# Patient Record
Sex: Female | Born: 1995 | Race: Black or African American | Hispanic: No | Marital: Single | Smoking: Never smoker
Health system: Southern US, Community
[De-identification: ages and names within clinical notes are randomized; demographics above are authoritative.]

## PROBLEM LIST (undated history)

## (undated) DIAGNOSIS — F988 Other specified behavioral and emotional disorders with onset usually occurring in childhood and adolescence: Secondary | ICD-10-CM

## (undated) DIAGNOSIS — F419 Anxiety disorder, unspecified: Secondary | ICD-10-CM

## (undated) HISTORY — PX: LIPOSUCTION: SHX10

## (undated) HISTORY — PX: KELOID EXCISION: SHX1856

## (undated) HISTORY — PX: TUMOR REMOVAL: SHX12

---
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
## 2019-03-09 ENCOUNTER — Other Ambulatory Visit: Payer: Self-pay

## 2019-03-09 ENCOUNTER — Emergency Department (INDEPENDENT_AMBULATORY_CARE_PROVIDER_SITE_OTHER)
Admission: EM | Admit: 2019-03-09 | Discharge: 2019-03-09 | Disposition: A | Discharge status: Home / Self Care | Payer: BC Managed Care – PPO | Source: Home / Self Care | Attending: Family Medicine | Admitting: Family Medicine

## 2019-03-09 ENCOUNTER — Emergency Department (INDEPENDENT_AMBULATORY_CARE_PROVIDER_SITE_OTHER): Payer: BC Managed Care – PPO

## 2019-03-09 ENCOUNTER — Encounter: Payer: Self-pay | Admitting: Emergency Medicine

## 2019-03-09 DIAGNOSIS — R51 Headache: Secondary | ICD-10-CM

## 2019-03-09 DIAGNOSIS — R079 Chest pain, unspecified: Secondary | ICD-10-CM

## 2019-03-09 DIAGNOSIS — M940 Chondrocostal junction syndrome [Tietze]: Secondary | ICD-10-CM

## 2019-03-09 DIAGNOSIS — R634 Abnormal weight loss: Secondary | ICD-10-CM | POA: Diagnosis not present

## 2019-03-09 DIAGNOSIS — K219 Gastro-esophageal reflux disease without esophagitis: Secondary | ICD-10-CM

## 2019-03-09 MED ORDER — PREDNISONE 20 MG PO TABS: ORAL_TABLET | ORAL | 0 refills | Status: DC

## 2019-03-09 MED ORDER — ESOMEPRAZOLE MAGNESIUM 40 MG PO CPDR: DELAYED_RELEASE_CAPSULE | ORAL | 0 refills | Status: DC

## 2019-03-09 NOTE — ED Provider Notes (Signed)
[facility]    CSN: [id number] Arrival date & time: 03/09/19  1711     History   Chief Complaint Chief Complaint  Patient presents with  . Chest Pain    HPI Lisa Woodard is a 23 y.o. female.   Patient complains of 3 week history of intermittent nausea without vomiting, and during the past 2 days she has had upper left anterior chest pain radiating to her left arm.  The chest pain is generally constant.  She states that she has had acid reflux "all my life" and frequently takes Tums with improvement.  She states that she has been eating a lot of "smoothies," and 3 weeks ago weighed 213 pounds (96kg).  She has not been trying to lose weight.  She denies changes in bowel movements.  The history is provided by the patient.  Chest Pain Pain location:  Substernal area and L chest Pain quality: aching and sharp   Pain radiates to:  L arm Pain severity:  Mild Onset quality:  Gradual Duration:  2 hours Timing:  Constant Progression:  Unchanged Chronicity:  New Context: movement and at rest   Context: not breathing, not eating, not lifting, not raising an arm, not stress and not trauma   Relieved by:  None tried Worsened by:  Deep breathing and movement Ineffective treatments:  None tried Associated symptoms: AICD problem, dysphagia, heartburn and nausea   Associated symptoms: no abdominal pain, no anorexia, no anxiety, no back pain, no claudication, no cough, no diaphoresis, no dizziness, no fatigue, no fever, no headache, no lower extremity edema, no near-syncope, no numbness, no orthopnea, no palpitations, no PND, no shortness of breath, no syncope, no vomiting and no weakness   Risk factors: obesity     Past medical history:  Obesity   Active problems:  Obesity, GERD   Surgical history:  "wisdom tooth" extraction 2018  OB History   No history of pregnancy      Home Medications    Prior to Admission medications   Medication Sig Start Date End Date  Taking? Authorizing Provider  esomeprazole (NEXIUM) 40 MG capsule Take one cap PO, 20 minutes AC 03/09/19   Lattie HawBeese, Aalijah Lanphere A, MD  predniSONE (DELTASONE) 20 MG tablet Take one tab by mouth twice daily for 4 days, then one daily. Take with food. 03/09/19   Lattie HawBeese, Zaylei Mullane A, MD    Family History Mother and brother have "heart problems"  Social History Social History   Tobacco Use  . Smoking status: Never Smoker  . Smokeless tobacco: Never Used  Substance Use Topics  . Alcohol use: Yes  . Drug use: No     Allergies   Patient has no known allergies.   Review of Systems Review of Systems  Constitutional: Positive for appetite change and unexpected weight change. Negative for chills, diaphoresis, fatigue and fever.  HENT: Positive for trouble swallowing.   Eyes: Negative.   Respiratory: Negative for cough and shortness of breath.   Cardiovascular: Positive for chest pain. Negative for palpitations, orthopnea, claudication, leg swelling, syncope, PND and near-syncope.  Gastrointestinal: Positive for heartburn and nausea. Negative for abdominal pain, anorexia and vomiting.  Genitourinary: Negative.   Musculoskeletal: Negative for back pain.  Skin: Negative.   Neurological: Negative for dizziness, weakness, numbness and headaches.     Physical Exam Triage Vital Signs ED Triage Vitals [03/09/19 1750]  Enc Vitals Group     BP 107/64     Pulse Rate 86  Resp      Temp 98.2 F (36.8 C)     Temp Source Oral     SpO2 97 %     Weight      Height      Head Circumference      Peak Flow      Pain Score      Pain Loc      Pain Edu?      Excl. in GC?    No data found.  Updated Vital Signs BP 107/64 (BP Location: Right Arm)   Pulse 86   Temp 98.2 F (36.8 C) (Oral)   Ht 5\' 4"  (1.626 m)   Wt 86.2 kg   LMP 03/01/2019   SpO2 97%   BMI 32.61 kg/m   Visual Acuity Right Eye Distance:   Left Eye Distance:   Bilateral Distance:    Right Eye Near:   Left Eye Near:     Bilateral Near:     Physical Exam Vitals signs and nursing note reviewed.  Constitutional:      General: She is not in acute distress.    Appearance: She is obese.  HENT:     Head: Normocephalic.     Right Ear: External ear normal.     Left Ear: External ear normal.     Nose: Nose normal.     Mouth/Throat:     Pharynx: Oropharynx is clear.  Eyes:     Extraocular Movements: Extraocular movements intact.     Conjunctiva/sclera: Conjunctivae normal.     Pupils: Pupils are equal, round, and reactive to light.  Neck:     Musculoskeletal: Normal range of motion and neck supple.  Cardiovascular:     Rate and Rhythm: Normal rate.     Heart sounds: Normal heart sounds.  Pulmonary:     Breath sounds: Normal breath sounds.  Chest:       Comments: Chest:  Distinct tenderness to palpation over the superior and mid-sternum as noted on diagram.  Palpation there recreates her pain. Abdominal:     General: Bowel sounds are normal.     Tenderness: There is no abdominal tenderness.  Musculoskeletal:     Right lower leg: No edema.     Left lower leg: No edema.  Lymphadenopathy:     Cervical: No cervical adenopathy.  Skin:    General: Skin is warm and dry.     Findings: No rash.  Neurological:     Mental Status: She is alert.      UC Treatments / Results  Labs (all labs ordered are listed, but only abnormal results are displayed) Labs Reviewed  COMPREHENSIVE METABOLIC PANEL  AMYLASE  LIPASE, BLOOD    EKG  Rate:  89 BPM PR:  146 msec QT:  348 msec QTcH:  423 msec QRSD:  74 msec QRS axis:  -10 degrees Interpretation:  Normal sinus rhythm; moderate voltage criteria for LVH, may be normal variant.  No acute changes   Radiology Dg Chest 2 View  Result Date: 03/09/2019 CLINICAL DATA:  Rapid weight loss.  Headache. EXAM: CHEST - 2 VIEW COMPARISON:  None. FINDINGS: The heart size and mediastinal contours are within normal limits. Both lungs are clear. The visualized skeletal  structures are unremarkable. IMPRESSION: No active cardiopulmonary disease. Electronically Signed   By: Gerome Samavid  Williams III M.D   On: 03/09/2019 18:39    Procedures Procedures (including critical care time)  Medications Ordered in UC Medications - No data to display  Initial Impression / Assessment and Plan / UC Course  I have reviewed the triage vital signs and the nursing notes.  Pertinent labs & imaging results that were available during my care of the patient were reviewed by me and considered in my medical decision making (see chart for details).    Unremarkable EKG and chest x-ray reassuring. Patient appears to have exacerbation of GERD with overlay of costochondritis.  Begin Nexium and prednisone burst. Doubt pancreatitis but will check CMP, amylase, and lipase. Followup with Family Doctor in about 2 weeks.    Final Clinical Impressions(s) / UC Diagnoses   Final diagnoses:  Chest pain, unspecified type  Gastroesophageal reflux disease, esophagitis presence not specified  Costochondritis     Discharge Instructions      Put ice on the painful area: ? Put ice in a plastic bag. ? Place a towel between your skin and the bag. ? Leave the ice on for 20 minutes, 2-3 times a day.    ED Prescriptions    Medication Sig Dispense Auth. Provider   predniSONE (DELTASONE) 20 MG tablet Take one tab by mouth twice daily for 4 days, then one daily. Take with food. 12 tablet Kandra Nicolas, MD   esomeprazole (NEXIUM) 40 MG capsule Take one cap PO, 20 minutes AC 30 capsule Kandra Nicolas, MD        Kandra Nicolas, MD 03/12/19 1351

## 2019-03-09 NOTE — Discharge Instructions (Addendum)
Put ice on the painful area. Put ice in a plastic bag. Place a towel between your skin and the bag. Leave the ice on for 20 minutes, 2-3 times a day. 

## 2019-03-09 NOTE — ED Triage Notes (Addendum)
Upper left chest pain radiates into left arm, nausea, headache, weakness x  2days 3 weeks ago she weighed 213, was not trying to lose weight.

## 2019-03-11 ENCOUNTER — Telehealth: Payer: Self-pay

## 2019-03-11 LAB — COMPREHENSIVE METABOLIC PANEL
AG Ratio: 1.4 (calc) (ref 1.0–2.5)
ALT: 11 U/L (ref 6–29)
AST: 26 U/L (ref 10–30)
Albumin: 4.7 g/dL (ref 3.6–5.1)
Alkaline phosphatase (APISO): 62 U/L (ref 31–125)
BUN/Creatinine Ratio: 7 (calc) (ref 6–22)
BUN: 6 mg/dL — ABNORMAL LOW (ref 7–25)
CO2: 26 mmol/L (ref 20–32)
Calcium: 9.9 mg/dL (ref 8.6–10.2)
Chloride: 102 mmol/L (ref 98–110)
Creat: 0.86 mg/dL (ref 0.50–1.10)
Globulin: 3.3 g/dL (calc) (ref 1.9–3.7)
Glucose, Bld: 68 mg/dL (ref 65–99)
Potassium: 4 mmol/L (ref 3.5–5.3)
Sodium: 137 mmol/L (ref 135–146)
Total Bilirubin: 0.3 mg/dL (ref 0.2–1.2)
Total Protein: 8 g/dL (ref 6.1–8.1)

## 2019-03-11 LAB — AMYLASE: Amylase: 20 U/L — ABNORMAL LOW (ref 21–101)

## 2019-03-11 LAB — LIPASE: Lipase: 38 U/L (ref 7–60)

## 2019-03-11 NOTE — Telephone Encounter (Signed)
Left voice message inquiring about patients status. Given lab results. Advised to f/u with PCP as suggested by Dr Assunta Found and repeat labs. Encouraged patient to call with questions or concerns.

## 2019-03-12 ENCOUNTER — Telehealth: Payer: Self-pay

## 2020-01-23 ENCOUNTER — Encounter: Payer: Self-pay | Admitting: *Deleted

## 2020-01-23 ENCOUNTER — Emergency Department (INDEPENDENT_AMBULATORY_CARE_PROVIDER_SITE_OTHER)
Admission: EM | Admit: 2020-01-23 | Discharge: 2020-01-23 | Disposition: A | Discharge status: Home / Self Care | Payer: BC Managed Care – PPO | Source: Home / Self Care | Attending: Internal Medicine | Admitting: Internal Medicine

## 2020-01-23 ENCOUNTER — Other Ambulatory Visit: Payer: Self-pay

## 2020-01-23 DIAGNOSIS — R5383 Other fatigue: Secondary | ICD-10-CM

## 2020-01-23 DIAGNOSIS — R55 Syncope and collapse: Secondary | ICD-10-CM

## 2020-01-23 HISTORY — DX: Other specified behavioral and emotional disorders with onset usually occurring in childhood and adolescence: F98.8

## 2020-01-23 HISTORY — DX: Anxiety disorder, unspecified: F41.9

## 2020-01-23 LAB — POCT CBC W AUTO DIFF (K'VILLE URGENT CARE)

## 2020-01-23 LAB — POCT URINE PREGNANCY: Preg Test, Ur: NEGATIVE

## 2020-01-23 LAB — TSH: TSH: 0.7 mIU/L

## 2020-01-23 NOTE — ED Triage Notes (Addendum)
Pt reports that she passed out yesterday while shopping. She reports that EMS came, but she refused transport. She reports donating blood 2 days ago. She also reports that she has been drinking lots of water and Pedialyte. She is leaving on a flight at 6pm today going to [facility].

## 2020-01-23 NOTE — ED Provider Notes (Signed)
[facility]    CSN: [id number] Arrival date & time: 01/23/20  0933      History   Chief Complaint Chief Complaint  Patient presents with  . Loss of Consciousness    HPI Lisa Woodard is a 24 y.o. female comes to the urgent care to be evaluated after she passed out yesterday was in a shopping mall.  The patient donated blood to the [employer] yesterday.  Following that she went to the shopping mall.  She had eaten prior to the blood donation.  She was playing at the counter when she suddenly felt faint.  No nausea or vomiting.  No chest pain, chest pressure or palpitations.  She was not drinking much fluids yesterday.  No fever or chills.  No upper respiratory infection symptoms.  Last menstrual period was about a month ago.  She is sexually active and engages in unprotected sexual intercourse.  No recent weight change.  Patient is scheduled to go to [facility].  She wants to be evaluated before leaving for [facility].   HPI  Past Medical History:  Diagnosis Date  . ADD (attention deficit disorder)   . Anxiety     There are no problems to display for this patient.   Past Surgical History:  Procedure Laterality Date  . KELOID EXCISION    . LIPOSUCTION    . TUMOR REMOVAL      OB History   No obstetric history on file.      Home Medications    Prior to Admission medications   Medication Sig Start Date End Date Taking? Authorizing Provider  triamcinolone ointment (KENALOG) 0.1 % Apply to legs twice daily as needed 04/20/18  Yes [provider]  buPROPion (WELLBUTRIN XL) 300 MG 24 hr tablet Take by mouth.    [provider]    Family History Family History  Problem Relation Age of Onset  . Heart disease Mother     Social History Social History   Tobacco Use  . Smoking status: Never Smoker  . Smokeless tobacco: Never Used  Substance Use Topics  . Alcohol use: Yes  . Drug use: Never     Allergies   Patient has no known  allergies.   Review of Systems Review of Systems  Constitutional: Positive for fatigue. Negative for activity change, chills and fever.  HENT: Negative.   Respiratory: Negative for chest tightness, shortness of breath and wheezing.   Cardiovascular: Negative for chest pain and palpitations.  Gastrointestinal: Negative.  Negative for abdominal pain, diarrhea, nausea and vomiting.  Endocrine: Negative.   Genitourinary: Negative.   Musculoskeletal: Negative.   Skin: Negative.  Negative for color change and pallor.  Neurological: Positive for syncope. Negative for dizziness and facial asymmetry.  Psychiatric/Behavioral: Negative for confusion and decreased concentration.     Physical Exam Triage Vital Signs ED Triage Vitals  Enc Vitals Group     BP 01/23/20 1000 118/71     Pulse Rate 01/23/20 1000 73     Resp 01/23/20 1000 16     Temp 01/23/20 1000 98.9 F (37.2 C)     Temp Source 01/23/20 1000 Oral     SpO2 01/23/20 1000 99 %     Weight 01/23/20 0956 184 lb (83.5 kg)     Height 01/23/20 0956 5\' 7"  (1.702 m)     Head Circumference --      Peak Flow --      Pain Score 01/23/20 0956 0  Pain Loc --      Pain Edu? --      Excl. in [place]? --    Orthostatic VS for the past 24 hrs:  BP- Lying Pulse- Lying BP- Sitting Pulse- Sitting BP- Standing at 0 minutes Pulse- Standing at 0 minutes  01/23/20 1114 109/72 82 117/78 76 118/78 81    Updated Vital Signs BP 118/71 (BP Location: Left Arm)   Pulse 73   Temp 98.9 F (37.2 C) (Oral)   Resp 16   Ht 5\' 7"  (1.702 m)   Wt 83.5 kg   LMP 12/24/2019   SpO2 99%   BMI 28.82 kg/m   Visual Acuity Right Eye Distance:   Left Eye Distance:   Bilateral Distance:    Right Eye Near:   Left Eye Near:    Bilateral Near:     Physical Exam Vitals and nursing note reviewed.  Constitutional:      General: She is not in acute distress.    Appearance: She is not ill-appearing.  HENT:     Right Ear: Tympanic membrane normal.     Left  Ear: Tympanic membrane normal.  Cardiovascular:     Rate and Rhythm: Normal rate and regular rhythm.     Pulses: Normal pulses.     Heart sounds: Normal heart sounds.  Pulmonary:     Effort: Pulmonary effort is normal. No respiratory distress.     Breath sounds: Normal breath sounds. No wheezing or rhonchi.  Abdominal:     General: Bowel sounds are normal.     Palpations: Abdomen is soft.     Tenderness: There is no abdominal tenderness.     Hernia: No hernia is present.  Musculoskeletal:        General: No swelling or signs of injury. Normal range of motion.     Cervical back: Normal range of motion and neck supple.     Right lower leg: No edema.  Skin:    General: Skin is warm.     Capillary Refill: Capillary refill takes less than 2 seconds.     Findings: No bruising or erythema.  Neurological:     General: No focal deficit present.     Mental Status: She is alert and oriented to person, place, and time. Mental status is at baseline.     Cranial Nerves: No cranial nerve deficit.     Sensory: No sensory deficit.     Motor: No weakness.     Coordination: Coordination normal.      UC Treatments / Results  Labs (all labs ordered are listed, but only abnormal results are displayed) Labs Reviewed  TSH  POCT URINE PREGNANCY  POCT CBC W AUTO DIFF ([place])    EKG   Radiology No results found.  Procedures Procedures (including critical care time)  Medications Ordered in UC Medications - No data to display  Initial Impression / Assessment and Plan / UC Course  I have reviewed the triage vital signs and the nursing notes.  Pertinent labs & imaging results that were available during my care of the patient were reviewed by me and considered in my medical decision making (see chart for details).    1.  Vasovagal versus orthostatic syncope: CBC, TSH and urine pregnancy test Urine pregnancy test is negative, hemoglobin is 12.4 Patient is encouraged to push  oral fluids Orthostatic blood pressures are unremarkable. Return precautions given Patient will need PCP follow-up for further evaluation of fatigue. Final Clinical Impressions(s) /  UC Diagnoses   Final diagnoses:  Fatigue, unspecified type  Vasovagal syncope   Discharge Instructions   None    ED Prescriptions    None     PDMP not reviewed this encounter.   Merrilee Jansky, MD 01/23/20 1335

## 2020-03-06 ENCOUNTER — Encounter: Payer: Self-pay | Admitting: Emergency Medicine

## 2020-03-06 ENCOUNTER — Emergency Department (INDEPENDENT_AMBULATORY_CARE_PROVIDER_SITE_OTHER)
Admission: EM | Admit: 2020-03-06 | Discharge: 2020-03-06 | Disposition: A | Discharge status: Home / Self Care | Payer: BC Managed Care – PPO | Source: Home / Self Care

## 2020-03-06 ENCOUNTER — Other Ambulatory Visit: Payer: Self-pay

## 2020-03-06 DIAGNOSIS — J019 Acute sinusitis, unspecified: Secondary | ICD-10-CM

## 2020-03-06 DIAGNOSIS — B9689 Other specified bacterial agents as the cause of diseases classified elsewhere: Secondary | ICD-10-CM | POA: Diagnosis not present

## 2020-03-06 MED ORDER — DEXAMETHASONE SODIUM PHOSPHATE 10 MG/ML IJ SOLN
10.0000 mg | Freq: Once | INTRAMUSCULAR | Status: AC
  Administered 2020-03-06: 10 mg via INTRAMUSCULAR

## 2020-03-06 MED ORDER — AMOXICILLIN-POT CLAVULANATE 875-125 MG PO TABS: 1.0000 | ORAL_TABLET | Freq: Two times a day (BID) | ORAL | 0 refills | Status: AC

## 2020-03-06 MED ORDER — IPRATROPIUM BROMIDE 0.06 % NA SOLN: 2.0000 | Freq: Four times a day (QID) | NASAL | 1 refills | Status: AC

## 2020-03-06 NOTE — Discharge Instructions (Signed)
You may take 500mg acetaminophen every 4-6 hours or in combination with ibuprofen 400-600mg every 6-8 hours as needed for pain, inflammation, and fever. ° °Be sure to well hydrated with clear liquids and get at least 8 hours of sleep at night, preferably more while sick.  ° °Please follow up with family medicine in 1 week if needed. ° °Please take antibiotics as prescribed and be sure to complete entire course even if you start to feel better to ensure infection does not come back. ° °

## 2020-03-06 NOTE — ED Provider Notes (Signed)
[facility]    CSN: [id number] Arrival date & time: 03/06/20  1724      History   Chief Complaint Chief Complaint  Patient presents with  . Nasal Congestion    HPI Lisa Woodard is a 24 y.o. female.   HPI  Lisa Woodard is a 24 y.o. female presenting to UC with c/o 2 weeks of worsening sinus congestion with pressure. Mild sore throat. Cough, and rhinorrhea. Cough worse at night. She went to the ED in [place] 2 nights ago. She was prescribed Afrin and Flonase but was told to stop the Afrin after 2 days. She was also offered a steroid shot at the time but declined it. Pt now requesting "a shot." No medication taken today. Denies fever, chills, n/v/d. Pt leave on a trip tomorrow and hopes to feel better by then.   Past Medical History:  Diagnosis Date  . ADD (attention deficit disorder)   . Anxiety     There are no problems to display for this patient.   Past Surgical History:  Procedure Laterality Date  . KELOID EXCISION    . LIPOSUCTION    . TUMOR REMOVAL      OB History   No obstetric history on file.      Home Medications    Prior to Admission medications   Medication Sig Start Date End Date Taking? Authorizing Provider  amoxicillin-clavulanate (AUGMENTIN) 875-125 MG tablet Take 1 tablet by mouth 2 (two) times daily. One po bid x 7 days 03/06/20   Lurene Shadow, PA-C  buPROPion (WELLBUTRIN XL) 300 MG 24 hr tablet Take by mouth.    [provider]  ipratropium (ATROVENT) 0.06 % nasal spray Place 2 sprays into both nostrils 4 (four) times daily. 03/06/20   Lurene Shadow, PA-C    Family History Family History  Problem Relation Age of Onset  . Heart disease Mother   . Healthy Father     Social History Social History   Tobacco Use  . Smoking status: Never Smoker  . Smokeless tobacco: Never Used  Vaping Use  . Vaping Use: Never used  Substance Use Topics  . Alcohol use: Yes  . Drug use: Never     Allergies   Patient has no known  allergies.   Review of Systems Review of Systems  Constitutional: Negative for chills and fever.  HENT: Positive for congestion, rhinorrhea, sinus pressure and sneezing. Negative for ear pain, sore throat, trouble swallowing and voice change.   Respiratory: Positive for cough. Negative for shortness of breath.   Cardiovascular: Negative for chest pain and palpitations.  Gastrointestinal: Negative for abdominal pain, diarrhea, nausea and vomiting.  Musculoskeletal: Negative for arthralgias, back pain and myalgias.  Skin: Negative for rash.  All other systems reviewed and are negative.    Physical Exam Triage Vital Signs ED Triage Vitals  Enc Vitals Group     BP 03/06/20 1735 125/76     Pulse Rate 03/06/20 1735 85     Resp --      Temp 03/06/20 1735 (!) 97.4 F (36.3 C)     Temp Source 03/06/20 1735 Oral     SpO2 03/06/20 1735 98 %     Weight 03/06/20 1736 183 lb (83 kg)     Height 03/06/20 1736 5\' 7"  (1.702 m)     Head Circumference --      Peak Flow --      Pain Score --      Pain  Loc --      Pain Edu? --      Excl. in [place]? --    No data found.  Updated Vital Signs BP 125/76 (BP Location: Right Arm)   Pulse 85   Temp (!) 97.4 F (36.3 C) (Oral)   Ht 5\' 7"  (1.702 m)   Wt 183 lb (83 kg)   SpO2 98%   BMI 28.66 kg/m   Visual Acuity Right Eye Distance:   Left Eye Distance:   Bilateral Distance:    Right Eye Near:   Left Eye Near:    Bilateral Near:     Physical Exam Vitals and nursing note reviewed.  Constitutional:      General: She is not in acute distress.    Appearance: Normal appearance. She is well-developed. She is not ill-appearing, toxic-appearing or diaphoretic.  HENT:     Head: Normocephalic and atraumatic.     Right Ear: Tympanic membrane and ear canal normal.     Left Ear: Tympanic membrane and ear canal normal.     Nose: Nose normal.     Right Sinus: No maxillary sinus tenderness or frontal sinus tenderness.     Left Sinus: No maxillary  sinus tenderness or frontal sinus tenderness.     Mouth/Throat:     Lips: Pink.     Mouth: Mucous membranes are moist.     Pharynx: Oropharynx is clear. Uvula midline.  Cardiovascular:     Rate and Rhythm: Normal rate and regular rhythm.  Pulmonary:     Effort: Pulmonary effort is normal. No respiratory distress.     Breath sounds: Normal breath sounds. No stridor. No wheezing, rhonchi or rales.  Musculoskeletal:        General: Normal range of motion.     Cervical back: Normal range of motion.  Skin:    General: Skin is warm and dry.  Neurological:     Mental Status: She is alert and oriented to person, place, and time.  Psychiatric:        Behavior: Behavior normal.      UC Treatments / Results  Labs (all labs ordered are listed, but only abnormal results are displayed) Labs Reviewed - No data to display  EKG   Radiology No results found.  Procedures Procedures (including critical care time)  Medications Ordered in UC Medications  dexamethasone (DECADRON) injection 10 mg (10 mg Intramuscular Given 03/06/20 1805)    Initial Impression / Assessment and Plan / UC Course  I have reviewed the triage vital signs and the nursing notes.  Pertinent labs & imaging results that were available during my care of the patient were reviewed by me and considered in my medical decision making (see chart for details).    Hx and exam c/w sinusitis given duration of worsening symptoms Will tx with Augmentin Decadron 10mg  IM given in UC AVS given  Final Clinical Impressions(s) / UC Diagnoses   Final diagnoses:  Acute bacterial sinusitis     Discharge Instructions      You may take 500mg  acetaminophen every 4-6 hours or in combination with ibuprofen 400-600mg  every 6-8 hours as needed for pain, inflammation, and fever.  Be sure to well hydrated with clear liquids and get at least 8 hours of sleep at night, preferably more while sick.   Please follow up with family medicine  in 1 week if needed.  Please take antibiotics as prescribed and be sure to complete entire course even if you start to feel  better to ensure infection does not come back.     ED Prescriptions    Medication Sig Dispense Auth. Provider   amoxicillin-clavulanate (AUGMENTIN) 875-125 MG tablet Take 1 tablet by mouth 2 (two) times daily. One po bid x 7 days 14 tablet Hektor Huston O, PA-C   ipratropium (ATROVENT) 0.06 % nasal spray Place 2 sprays into both nostrils 4 (four) times daily. 15 mL Lurene Shadow, PA-C     PDMP not reviewed this encounter.   Lurene Shadow, [place] 03/06/20 1846

## 2020-03-06 NOTE — ED Triage Notes (Signed)
Congestion, runny nose, cough at night x 2 weeks

## 2020-04-01 ENCOUNTER — Emergency Department (HOSPITAL_COMMUNITY): Payer: BC Managed Care – PPO

## 2020-04-01 ENCOUNTER — Encounter (HOSPITAL_COMMUNITY): Payer: Self-pay | Admitting: Emergency Medicine

## 2020-04-01 ENCOUNTER — Other Ambulatory Visit: Payer: Self-pay

## 2020-04-01 ENCOUNTER — Emergency Department (HOSPITAL_COMMUNITY)
Admission: EM | Admit: 2020-04-01 | Discharge: 2020-04-01 | Disposition: A | Discharge status: Home / Self Care | Payer: BC Managed Care – PPO | Attending: Emergency Medicine | Admitting: Emergency Medicine

## 2020-04-01 DIAGNOSIS — Z5321 Procedure and treatment not carried out due to patient leaving prior to being seen by health care provider: Secondary | ICD-10-CM | POA: Insufficient documentation

## 2020-04-01 DIAGNOSIS — K5900 Constipation, unspecified: Secondary | ICD-10-CM | POA: Insufficient documentation

## 2020-04-01 DIAGNOSIS — R079 Chest pain, unspecified: Secondary | ICD-10-CM | POA: Diagnosis not present

## 2020-04-01 DIAGNOSIS — R1084 Generalized abdominal pain: Secondary | ICD-10-CM | POA: Diagnosis not present

## 2020-04-01 LAB — URINALYSIS, ROUTINE W REFLEX MICROSCOPIC
Bilirubin Urine: NEGATIVE
Glucose, UA: NEGATIVE mg/dL
Hgb urine dipstick: NEGATIVE
Ketones, ur: NEGATIVE mg/dL
Leukocytes,Ua: NEGATIVE
Nitrite: NEGATIVE
Protein, ur: NEGATIVE mg/dL
Specific Gravity, Urine: 1.018 (ref 1.005–1.030)
pH: 7 (ref 5.0–8.0)

## 2020-04-01 LAB — BASIC METABOLIC PANEL
Anion gap: 5 (ref 5–15)
BUN: 9 mg/dL (ref 6–20)
CO2: 28 mmol/L (ref 22–32)
Calcium: 9.1 mg/dL (ref 8.9–10.3)
Chloride: 104 mmol/L (ref 98–111)
Creatinine, Ser: 0.86 mg/dL (ref 0.44–1.00)
GFR calc Af Amer: >60 mL/min (ref >60)
GFR calc non Af Amer: >60 mL/min (ref >60)
Glucose, Bld: 109 mg/dL — ABNORMAL HIGH (ref 70–99)
Potassium: 4 mmol/L (ref 3.5–5.1)
Sodium: 137 mmol/L (ref 135–145)

## 2020-04-01 LAB — CBC
HCT: 38.9 % (ref 36.0–46.0)
Hemoglobin: 11.9 g/dL — ABNORMAL LOW (ref 12.0–15.0)
MCH: 28.9 pg (ref 26.0–34.0)
MCHC: 30.6 g/dL (ref 30.0–36.0)
MCV: 94.4 fL (ref 80.0–100.0)
Platelets: 243 10*3/uL (ref 150–400)
RBC: 4.12 MIL/uL (ref 3.87–5.11)
RDW: 13.9 % (ref 11.5–15.5)
WBC: 6.6 10*3/uL (ref 4.0–10.5)
nRBC: 0 % (ref 0.0–0.2)

## 2020-04-01 LAB — I-STAT BETA HCG BLOOD, ED (MC, WL, AP ONLY): I-stat hCG, quantitative: <5 m[IU]/mL (ref <5)

## 2020-04-01 LAB — TROPONIN I (HIGH SENSITIVITY): Troponin I (High Sensitivity): 3 ng/L (ref <18)

## 2020-04-01 LAB — LIPASE, BLOOD: Lipase: 42 U/L (ref 11–51)

## 2020-04-01 MED ORDER — SODIUM CHLORIDE 0.9% FLUSH: 3.0000 mL | Freq: Once | INTRAVENOUS | Status: DC

## 2020-04-01 NOTE — ED Notes (Signed)
Called pt to see where she was she stated she is going to her doctor office.

## 2020-04-01 NOTE — ED Triage Notes (Signed)
Pt in w/generalized abdominal pain since last night. States she always has problem with chronic constipation, goes only every 1-2 wks. Reports pain is pressing into chest, and she is a little nauseous. Last BM 1 wk ago

## 2020-09-16 ENCOUNTER — Ambulatory Visit: Payer: BC Managed Care – PPO

## 2020-10-20 IMAGING — DX DG ABDOMEN 1V
2 series · 2 of 2 positions shown · non-contrast
Comparison: Chest x-ray same day and 03/09/2019.

CLINICAL DATA: Abdominal pain.  Constipation.

EXAM:
ABDOMEN - 1 VIEW

[abdomen kub (1 of 2)]
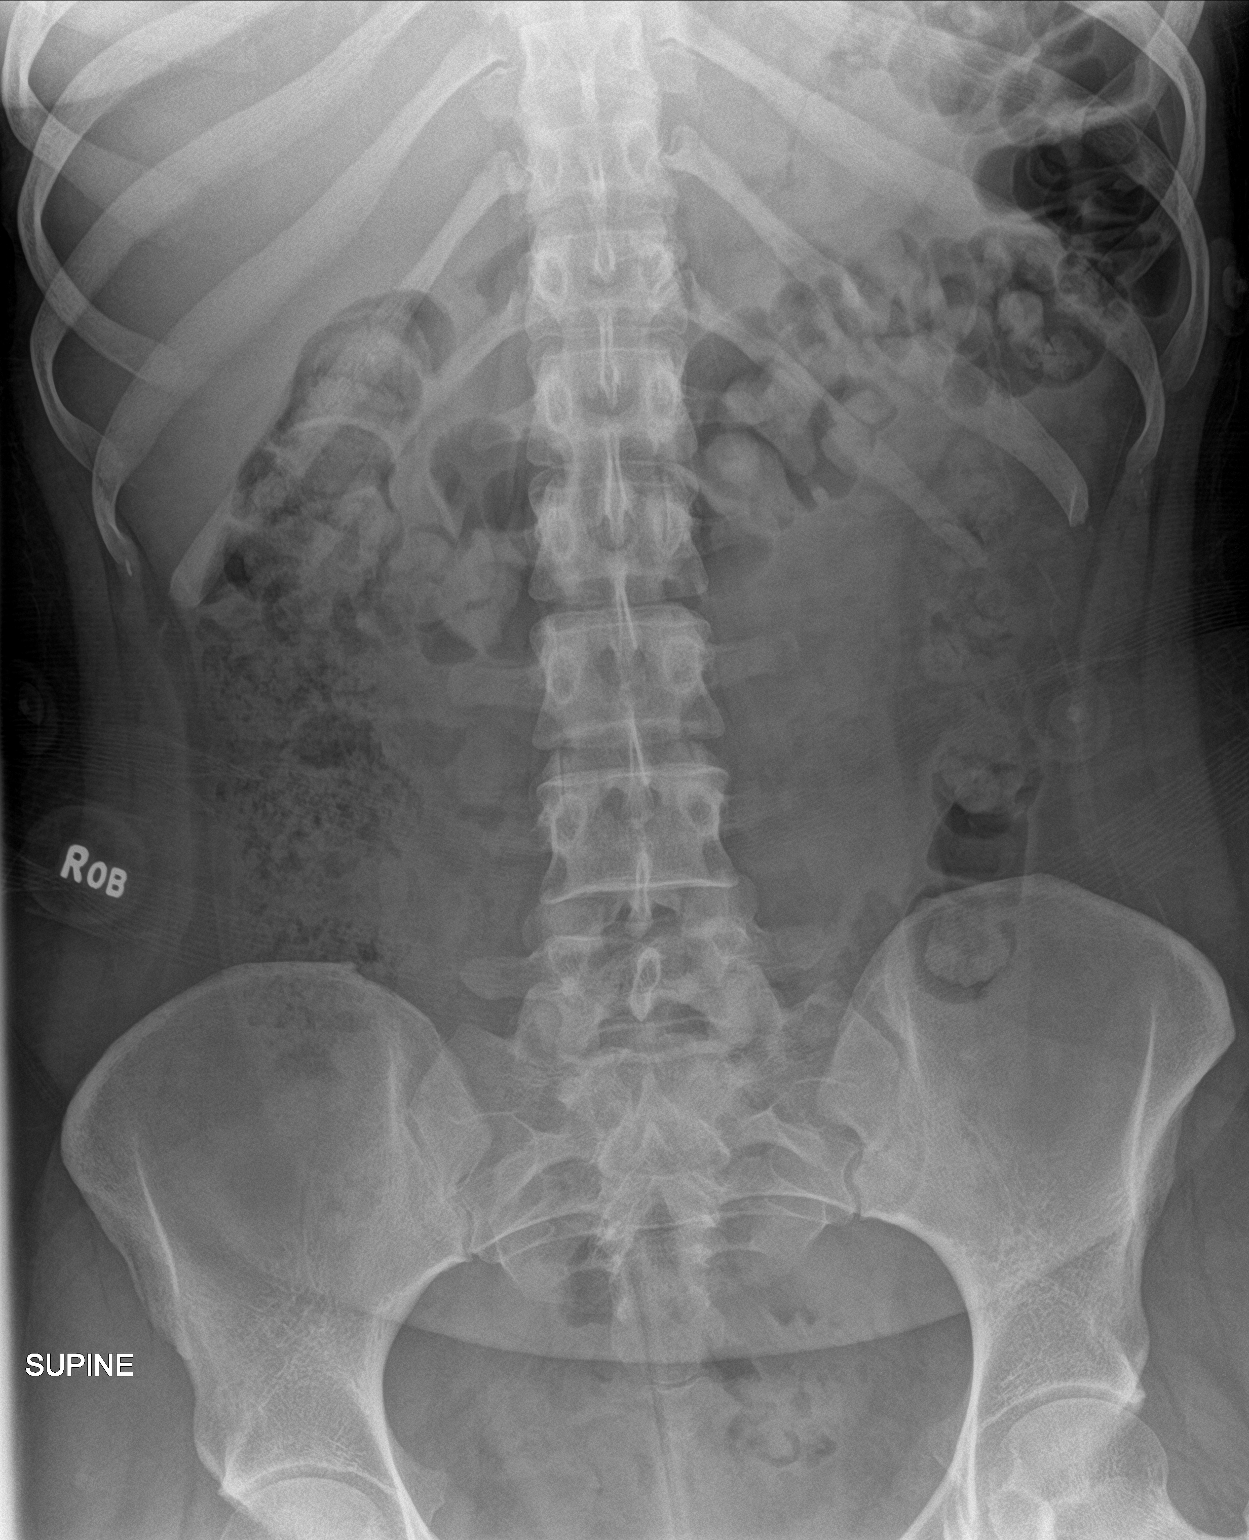

[abdomen kub (2 of 2)]
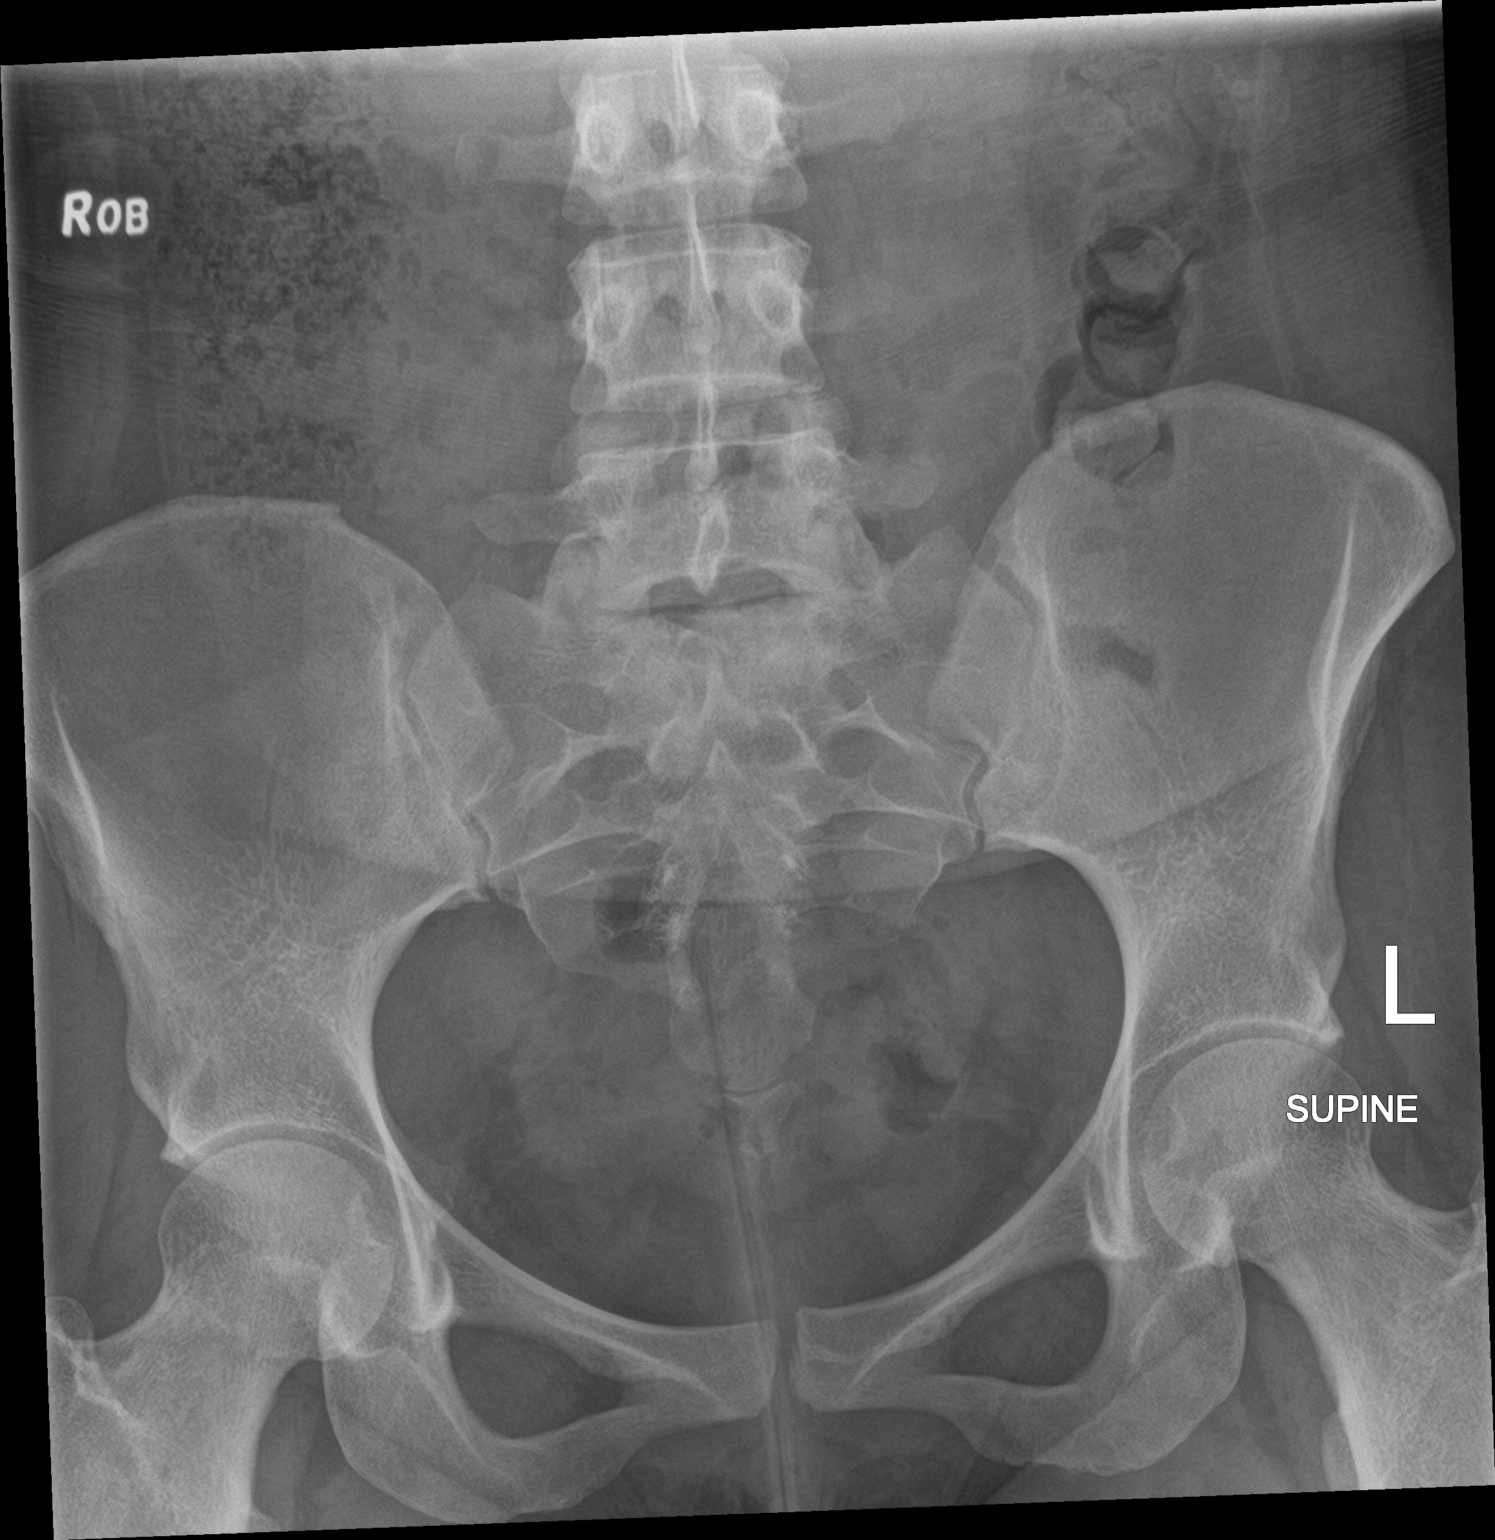

[2 of 2 positions shown; findings below may reference images not displayed]

FINDINGS: Soft tissue structures are unremarkable. Stool noted throughout the
colon and rectum. Constipation could present this fashion. No bowel
distention. No free air. No pathologic intra-abdominal
calcifications. No acute bony abnormality.
IMPRESSION: Stool noted throughout the colon and rectum. Constipation could
present this fashion. No bowel distention or free air.

## 2020-10-20 IMAGING — DX DG CHEST 2V
2 series · 2 of 2 positions shown · non-contrast
Comparison: Chest x-ray 03/09/2019.

CLINICAL DATA: Abdominal pain.  Chronic constipation.  Chest pain.

EXAM:
CHEST - 2 VIEW

[chest pa]
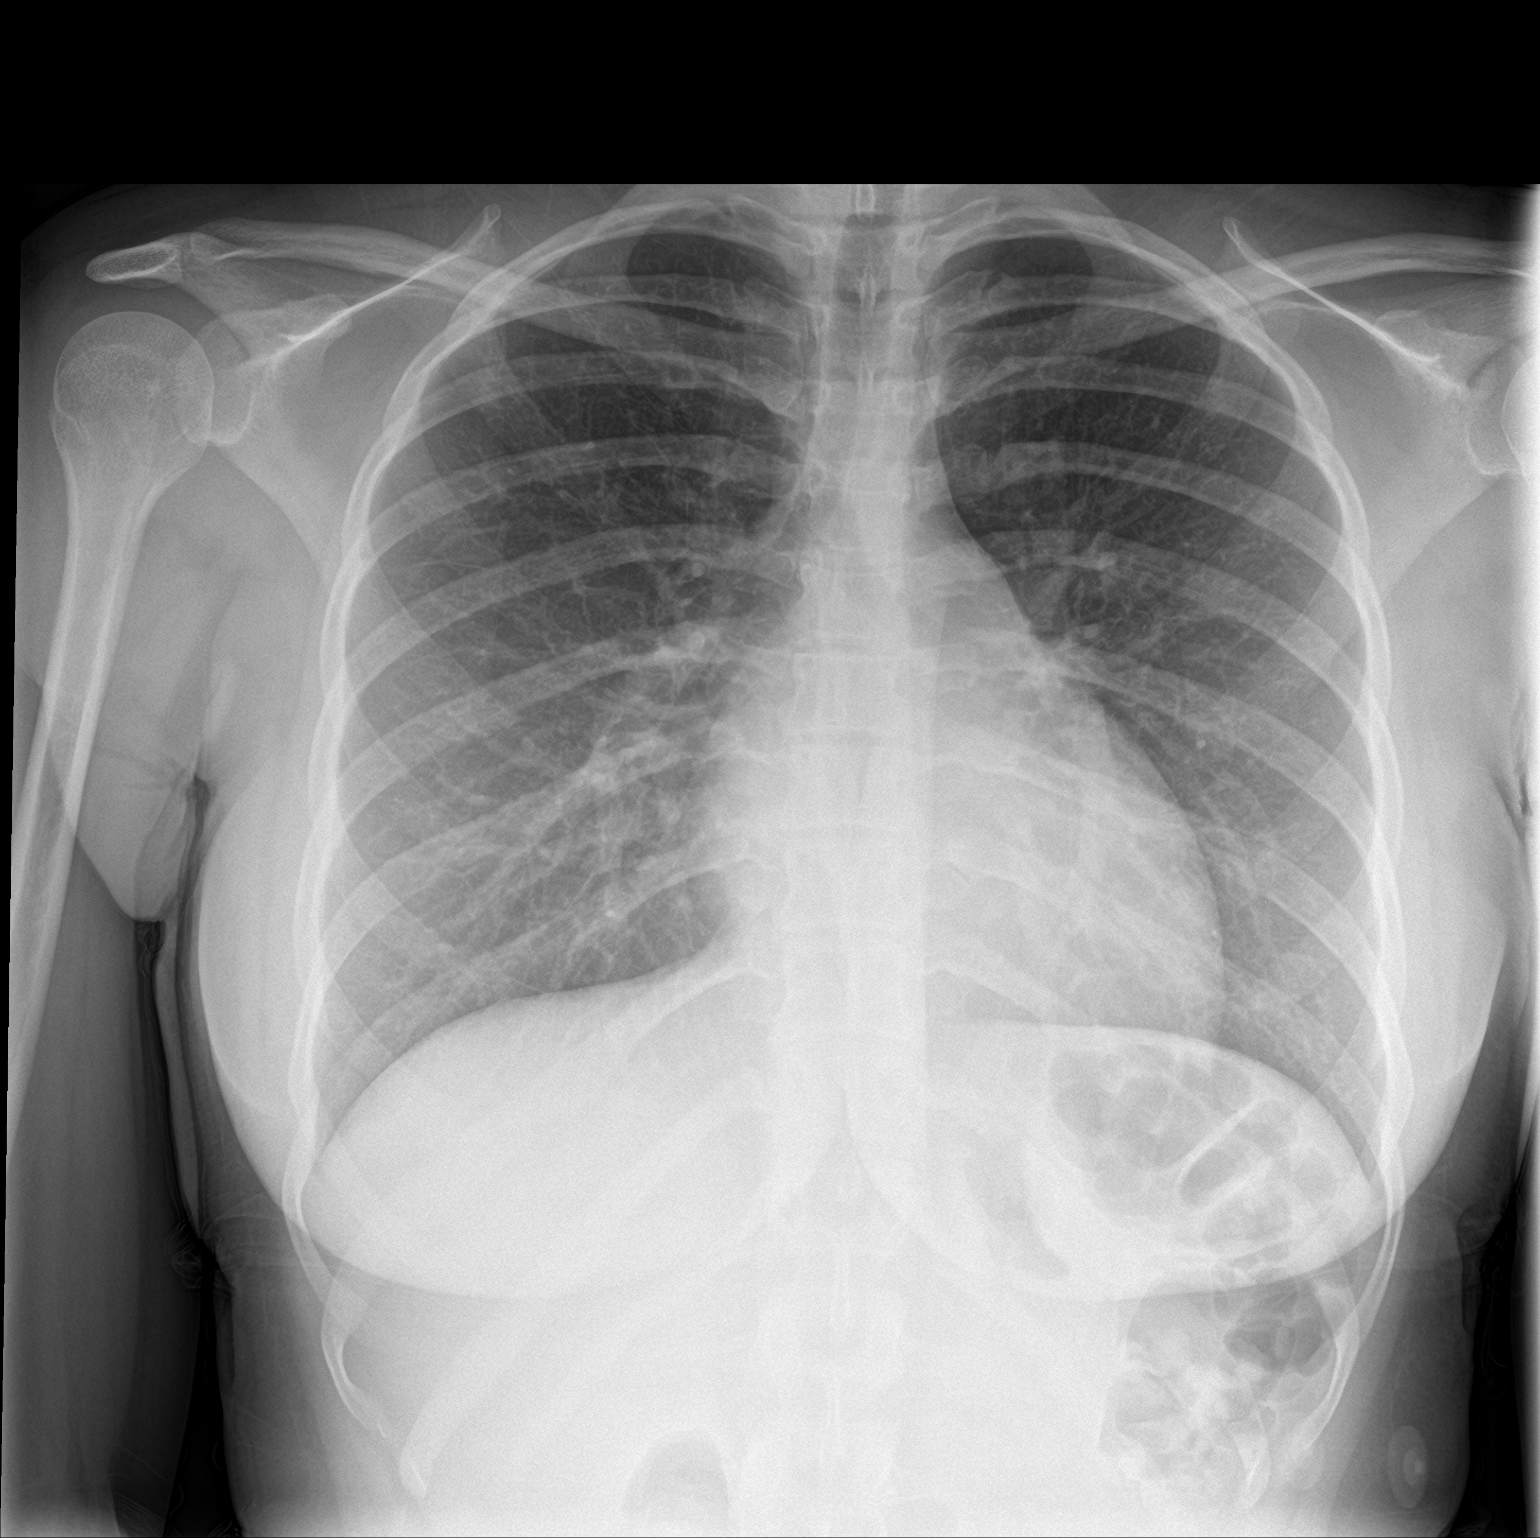

[chest lat]
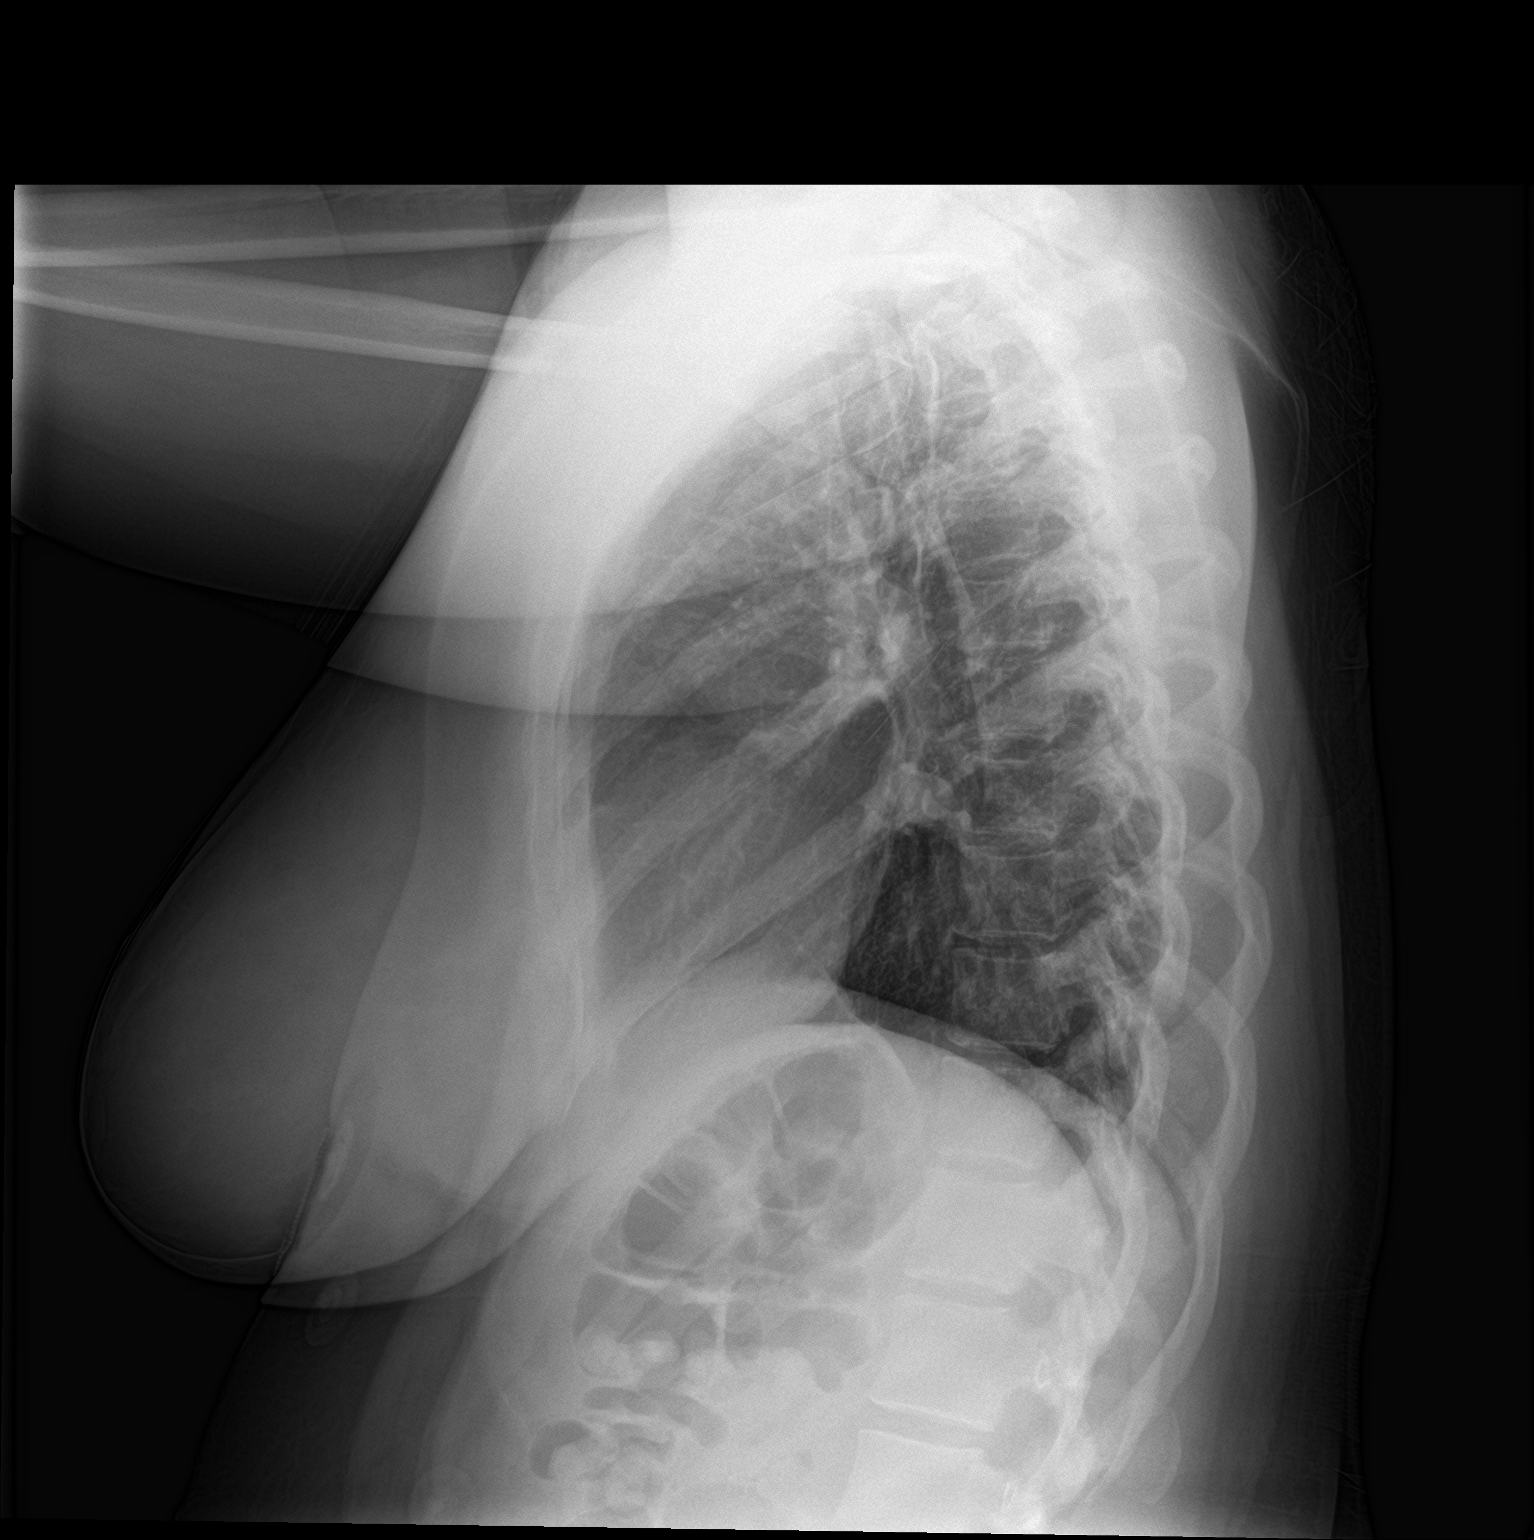

[2 of 2 positions shown; findings below may reference images not displayed]

FINDINGS: Mediastinum hilar structures normal. Lungs are clear. No pleural
effusion or pneumothorax. Heart size normal. No acute bony
abnormality.
IMPRESSION: No acute cardiopulmonary disease.
